# Patient Record
Sex: Male | Born: 1960 | Race: Black or African American | Hispanic: No | Marital: Single
Health system: Southern US, Community
[De-identification: ages and names within clinical notes are randomized; demographics above are authoritative.]

## PROBLEM LIST (undated history)

## (undated) DIAGNOSIS — N200 Calculus of kidney: Secondary | ICD-10-CM

---

## 2010-01-23 ENCOUNTER — Emergency Department (HOSPITAL_COMMUNITY): Admission: EM | Admit: 2010-01-23 | Discharge: 2010-01-23 | Payer: Self-pay | Admitting: Emergency Medicine

## 2010-10-27 IMAGING — CT CT ABD-PELV W/O CM
2 of 3 series · 17 of 46 positions shown, 19 images · non-contrast
Comparison: None

CLINICAL DATA: Left-sided pain, left flank pain

CT ABDOMEN AND PELVIS WITHOUT CONTRAST
TECHNIQUE: Multidetector CT imaging of the abdomen and pelvis was
performed following the standard protocol without intravenous
contrast.

[Series 2: under 200# stone no prev · axial · 0.68mm/px · z∈[-372,+12]mm · 14 of 89 slices shown, 16 images]
[im 6/89  soft-tissue]
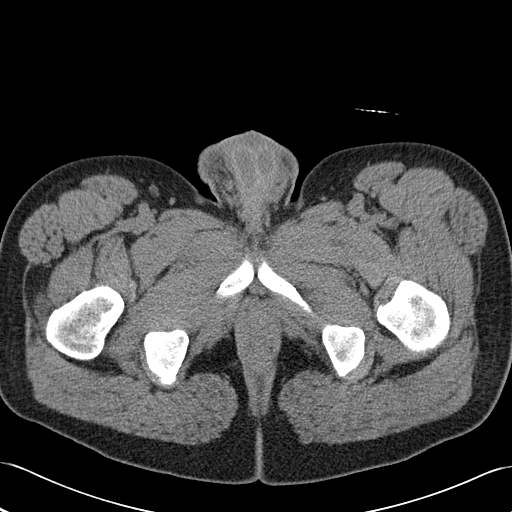
[im 6/89  bone]
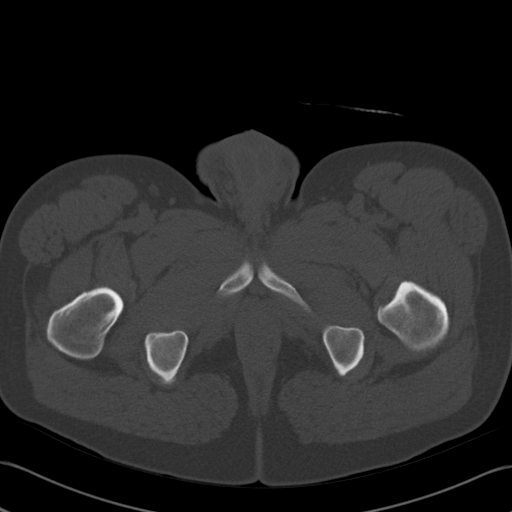
[im 12/89  soft-tissue]
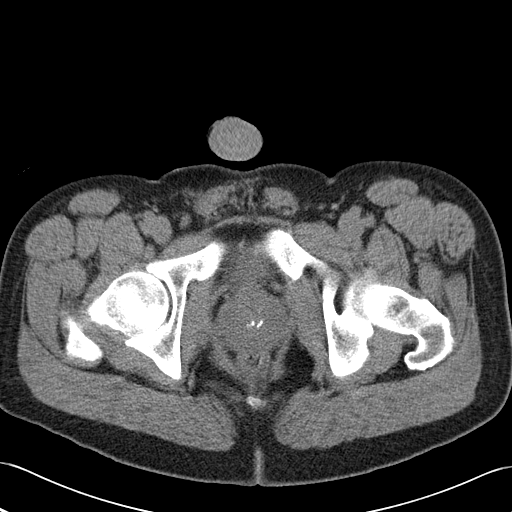
[im 18/89  soft-tissue]
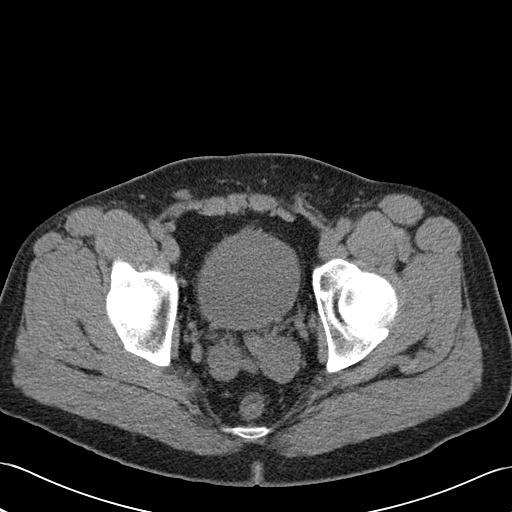
[im 23/89  soft-tissue]
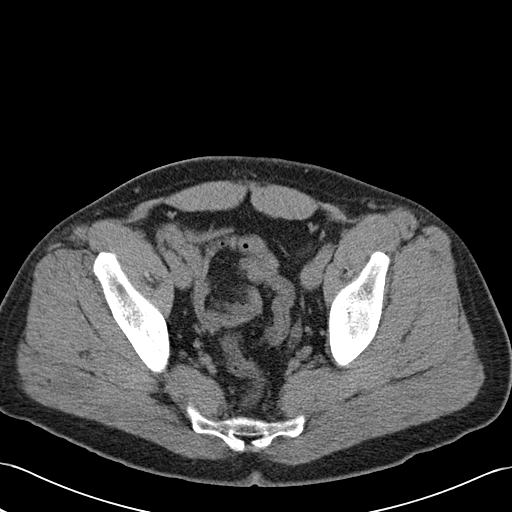
[im 29/89  soft-tissue]
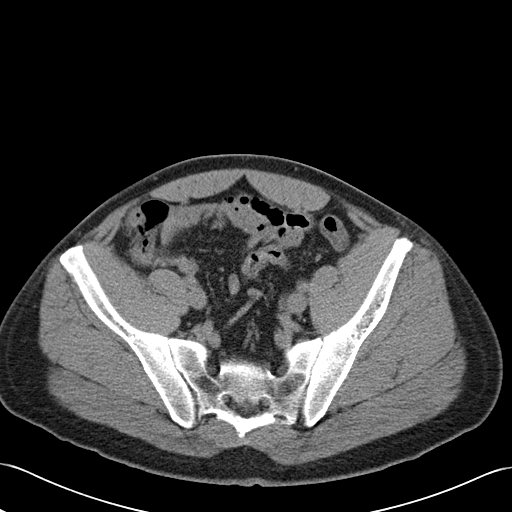
[im 35/89  soft-tissue]
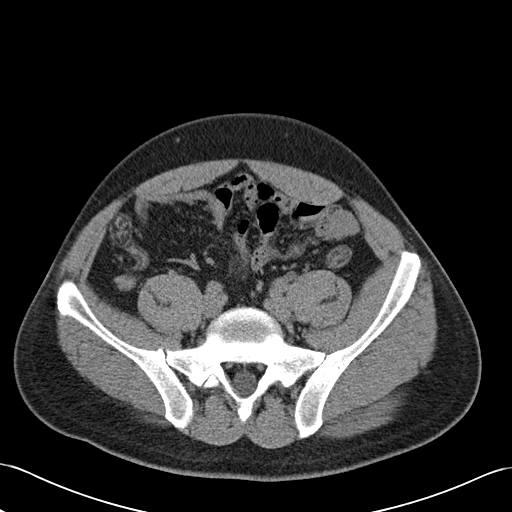
[im 40/89  soft-tissue]
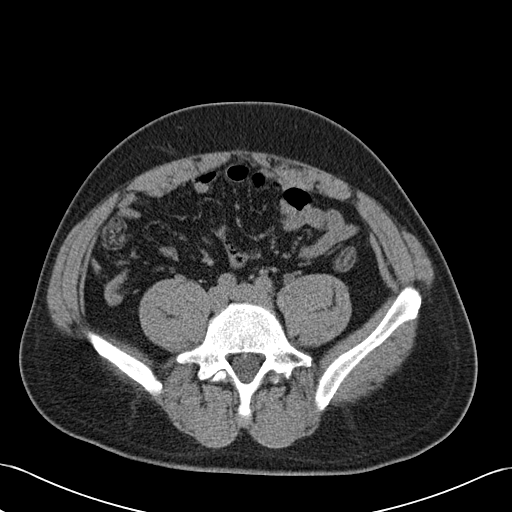
[im 49/89  soft-tissue]
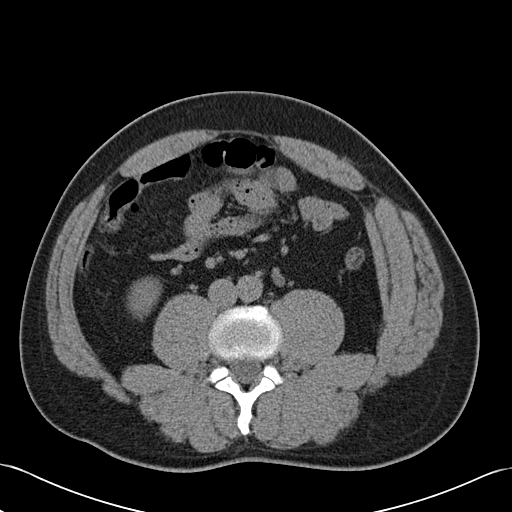
[im 54/89  soft-tissue]
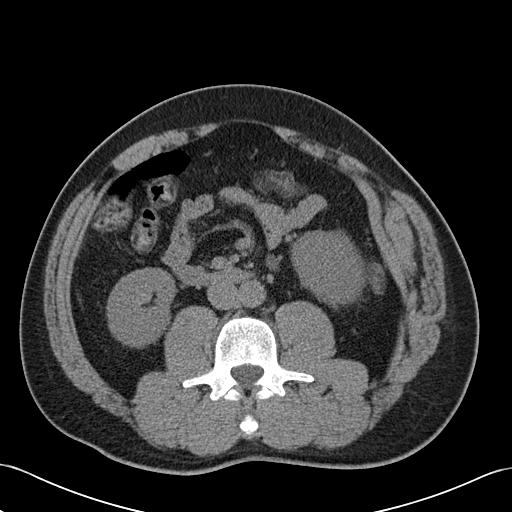
[im 54/89  bone]
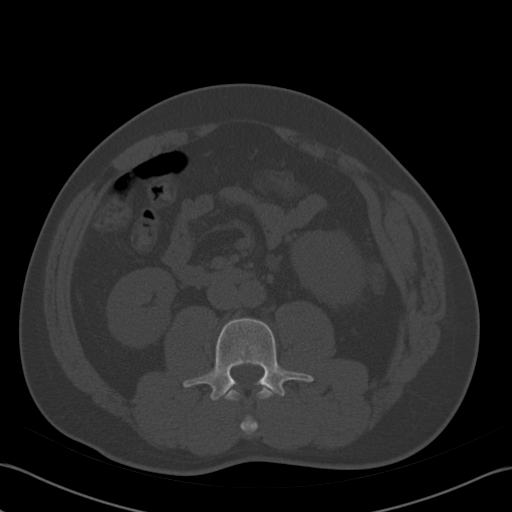
[im 60/89  soft-tissue]
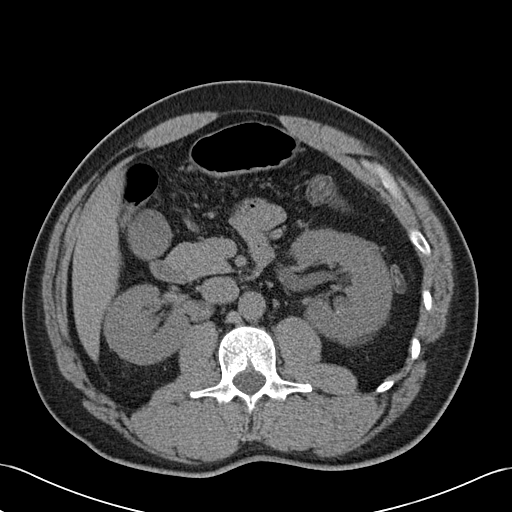
[im 66/89  soft-tissue]
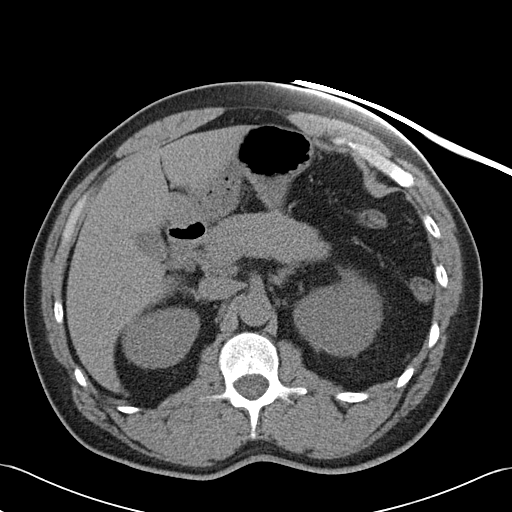
[im 71/89  soft-tissue]
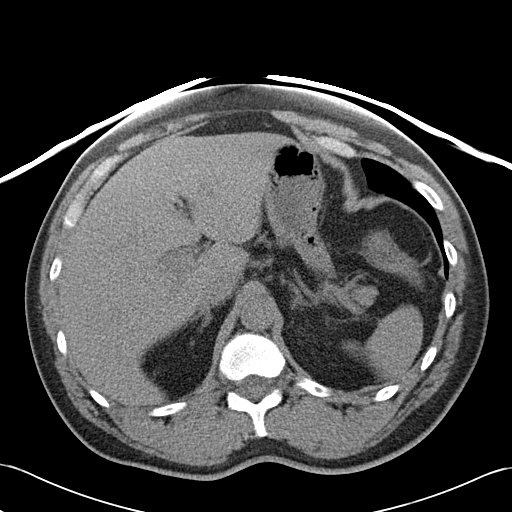
[im 77/89  soft-tissue]
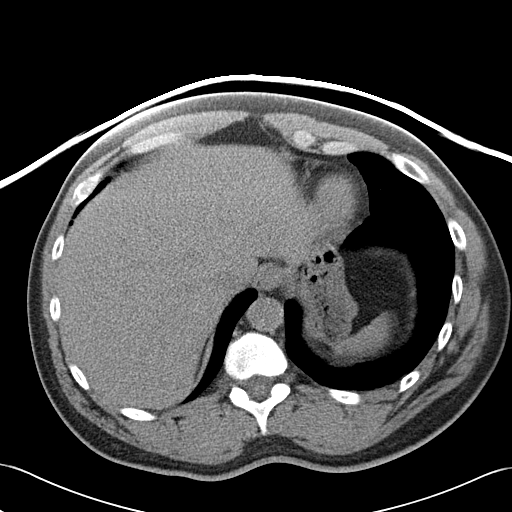
[im 83/89  soft-tissue]
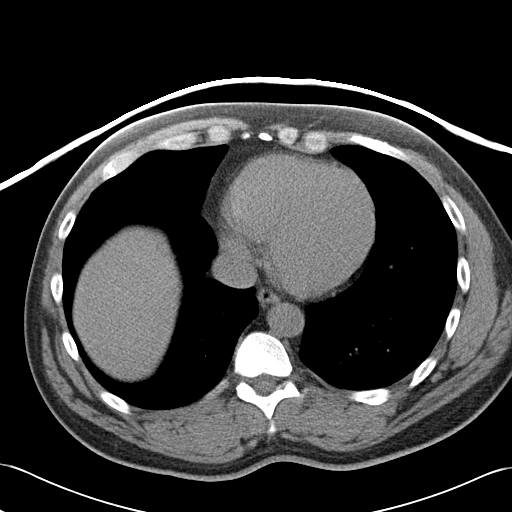

[Series 602: <mpr thick range> · coronal · 0.87mm/px · 3 of 90 slices shown]
[im 30/90  soft-tissue]
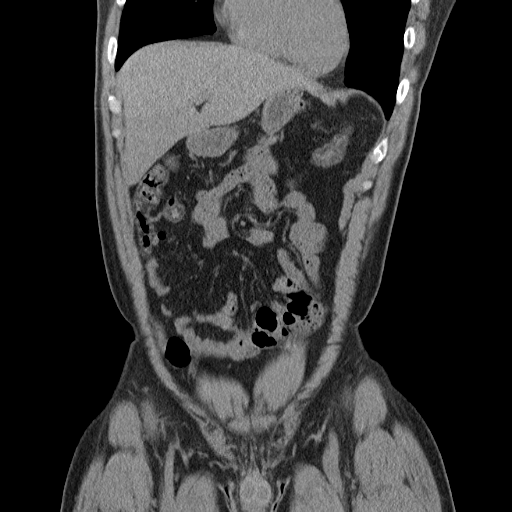
[im 40/90  soft-tissue]
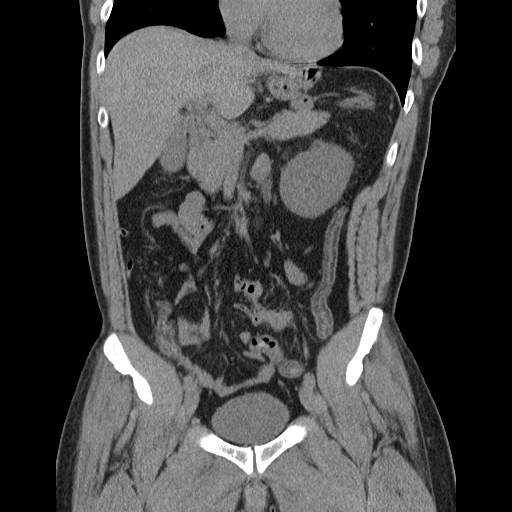
[im 50/90  soft-tissue]
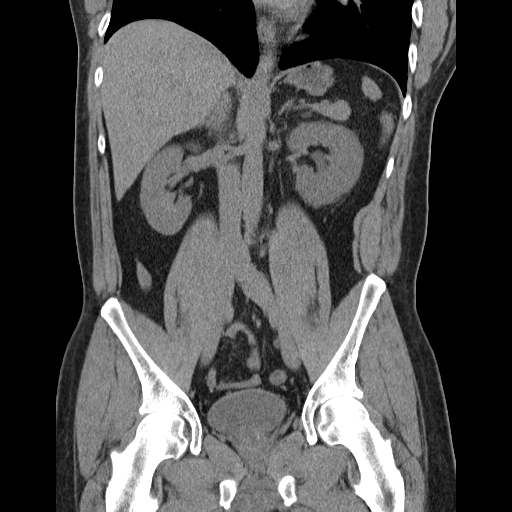

[17 of 46 positions shown; findings below may reference images not displayed]

FINDINGS: Lung bases are clear.  No pericardial effusion.

Non-IV contrast images demonstrate no focal hepatic lesion.  The
gallbladder, pancreas, spleen, adrenal glands are normal.

The left kidney is edematous with pelvicaliectasis.  There is mild
hydroureter on the left  secondary to a distal obstructing stone in
the left ureter measuring 4 mm in axial dimension by 6 cm in
craniocaudad dimension.  This stone is approximately 3 cm from the
vesicoureteral junction.  There is a second nonobstructing calculus
within the left kidney which  measures 6 mm.  No evidence of right
renal stones or obstructive uropathy.

The stomach, small bowel, appendix, and cecum are normal.  The
colon is collapsed.  The rectum appears normal.

The bladder appears normal.  Prostate appears normal.  No evidence
of pelvic lymphadenopathy. Review of  bone windows demonstrates no
aggressive osseous lesions.
IMPRESSION: 1. A  6 mm calculus within the distal left ureter with mild to
moderate obstructive uropathy.
2.  Left nephrolithiasis.

## 2010-12-19 LAB — URINE MICROSCOPIC-ADD ON

## 2010-12-19 LAB — URINALYSIS, ROUTINE W REFLEX MICROSCOPIC
Nitrite: NEGATIVE
Protein, ur: NEGATIVE mg/dL
Urobilinogen, UA: 0.2 mg/dL (ref 0.0–1.0)
pH: 5.5 (ref 5.0–8.0)

## 2010-12-19 LAB — CBC
HCT: 46.7 % (ref 39.0–52.0)
MCHC: 32.8 g/dL (ref 30.0–36.0)
MCV: 93.3 fL (ref 78.0–100.0)
Platelets: 299 10*3/uL (ref 150–400)
RDW: 13 % (ref 11.5–15.5)

## 2010-12-19 LAB — DIFFERENTIAL
Basophils Absolute: 0 10*3/uL (ref 0.0–0.1)
Eosinophils Relative: 0 % (ref 0–5)
Monocytes Absolute: 0.2 10*3/uL (ref 0.1–1.0)
Neutrophils Relative %: 93 % — ABNORMAL HIGH (ref 43–77)

## 2010-12-19 LAB — BASIC METABOLIC PANEL
Creatinine, Ser: 1.32 mg/dL (ref 0.4–1.5)
GFR calc Af Amer: >60 mL/min (ref >60)
Glucose, Bld: 130 mg/dL — ABNORMAL HIGH (ref 70–99)

## 2016-07-20 ENCOUNTER — Ambulatory Visit: Payer: Self-pay | Admitting: Nurse Practitioner

## 2016-07-31 ENCOUNTER — Telehealth: Payer: Self-pay | Admitting: Gastroenterology

## 2016-07-31 NOTE — Telephone Encounter (Signed)
Received pt records and placed on Dr.Danis desk for review. Dr.Danis dod

## 2016-08-01 ENCOUNTER — Encounter: Payer: Self-pay | Admitting: Gastroenterology

## 2016-08-01 NOTE — Telephone Encounter (Signed)
Dr. Myrtie Neitheranis reviewed records and has accepted patient. Ok to schedule Direct colon. Colonoscopy scheduled.

## 2016-10-09 ENCOUNTER — Encounter: Payer: Self-pay | Admitting: Gastroenterology

## 2016-10-25 NOTE — Progress Notes (Deleted)
Called patient and left a message to call us and reschedule his pre-visit.  I told him to call us soon or else we will have to cancel his colonoscopy.

## 2016-11-02 ENCOUNTER — Encounter: Payer: Self-pay | Admitting: Gastroenterology

## 2020-06-16 ENCOUNTER — Encounter: Payer: Self-pay | Admitting: Gastroenterology

## 2021-06-18 ENCOUNTER — Other Ambulatory Visit: Payer: Self-pay

## 2021-06-18 ENCOUNTER — Emergency Department (HOSPITAL_COMMUNITY)
Admission: EM | Admit: 2021-06-18 | Discharge: 2021-06-19 | Disposition: A | Discharge status: Home / Self Care | Payer: BLUE CROSS/BLUE SHIELD | Attending: Emergency Medicine | Admitting: Emergency Medicine

## 2021-06-18 ENCOUNTER — Encounter (HOSPITAL_COMMUNITY): Payer: Self-pay | Admitting: Emergency Medicine

## 2021-06-18 DIAGNOSIS — Z5321 Procedure and treatment not carried out due to patient leaving prior to being seen by health care provider: Secondary | ICD-10-CM | POA: Diagnosis not present

## 2021-06-18 DIAGNOSIS — R109 Unspecified abdominal pain: Secondary | ICD-10-CM | POA: Diagnosis not present

## 2021-06-18 HISTORY — DX: Calculus of kidney: N20.0

## 2021-06-18 NOTE — ED Triage Notes (Signed)
PT c/o intermittent L flank and abdominal pain radiating to scrotum. Denies vomiting and diarrhea.
